# Patient Record
Sex: Male | Born: 1970 | Race: Black or African American | Hispanic: No | Marital: Married
Health system: Southern US, Community
[De-identification: ages and names within clinical notes are randomized; demographics above are authoritative.]

## PROBLEM LIST (undated history)

## (undated) DIAGNOSIS — I10 Essential (primary) hypertension: Secondary | ICD-10-CM

---

## 2006-04-16 ENCOUNTER — Emergency Department (HOSPITAL_COMMUNITY): Admission: EM | Admit: 2006-04-16 | Discharge: 2006-04-16 | Payer: Self-pay | Admitting: Emergency Medicine

## 2008-05-24 ENCOUNTER — Ambulatory Visit (HOSPITAL_COMMUNITY): Admission: RE | Admit: 2008-05-24 | Discharge: 2008-05-25 | Payer: Self-pay | Admitting: Otolaryngology

## 2010-06-25 LAB — CBC
Hemoglobin: 15.4 g/dL (ref 13.0–17.0)
MCV: 88.6 fL (ref 78.0–100.0)
RBC: 5.07 MIL/uL (ref 4.22–5.81)
WBC: 7.9 10*3/uL (ref 4.0–10.5)

## 2010-06-25 LAB — BASIC METABOLIC PANEL
BUN: 11 mg/dL (ref 6–23)
Calcium: 9.5 mg/dL (ref 8.4–10.5)
Potassium: 4.3 mEq/L (ref 3.5–5.1)

## 2010-07-28 NOTE — Op Note (Signed)
Douglas Dodson, Douglas Dodson                 ACCOUNT NO.:  [id number]   MEDICAL RECORD NO.:  [id number]          PATIENT TYPE:  OIB   LOCATION:  5126                         FACILITY:  [facility]   PHYSICIAN:  Newman Pies, MD            DATE OF BIRTH:  06/15/1970   DATE OF PROCEDURE:  05/24/2008  DATE OF DISCHARGE:                               OPERATIVE REPORT   SURGEON:  Newman Pies, MD   PREOPERATIVE DIAGNOSES:  1. Nasal septal deviation.  2. Bilateral inferior turbinate hypertrophy.   POSTOPERATIVE DIAGNOSES:  1. Nasal septal deviation.  2. Bilateral inferior turbinate hypertrophy.   PROCEDURE PERFORMED:  1. Septoplasty.  2. Bilateral partial inferior turbinate resection.   ANESTHESIA:  General endotracheal tube anesthesia.   COMPLICATIONS:  None.   ESTIMATED BLOOD LOSS:  Less than 20 mL.   INDICATIONS FOR PROCEDURE:  The patient is a 40 year old male with a  history of nasal septal deviation, bilateral inferior turbinate  hypertrophy, and chronic nasal obstruction.  He was previously treated  with steroid nasal spray, decongestant and antihistamine, and nasal  saline irrigation.  However, he continues to be symptomatic.  Based on  the above findings, the decision was made for the patient to undergo  septoplasty and bilateral partial inferior turbinate resection.  The  risks, benefits, alternatives, and details of the procedure were  discussed with the patient.  Questions were invited and answered.  Informed consent was obtained.   DESCRIPTION:  The patient was taken to the operating room and placed  supine on the operating table.  General endotracheal tube anesthesia was  administered by the anesthesiologist.  Preop IV antibiotics was given.  The patient was positioned and prepped and draped in the standard  fashion for nasal surgery.  Pledgets soaked with Afrin was placed in  both nasal cavities.  The pledgets were subsequently removed.  A 1%  lidocaine with 1:100,000 epinephrine was  injected onto the nasal septum  and the lateral nasal wall bilaterally.  A standard hemitransfixion  incision was made via the left nostril.  The submucosal flap was  elevated in a standard fashion on the left side.  Cartilaginous incision  was made approximately 1 cm superior to the caudal margin of the nasal  septum.  The mucosal flap was elevated on the contralateral side.  The  deviated portion of the cartilaginous and bony nasal septum were  removed.  The mucosa was then reapproximated with 4-0 plain gut sutures.  The hemitransfixion incision was closed with interrupted 4-0 chromic  sutures.   Attention was then focused on the inferior turbinates.  Both inferior  turbinates with significantly hypertrophy.  The inferior one-half of  each turbinate was clamped with a straight Kelly clamp.  They were  resected with the crosscutting scissors.  The same procedure was  repeated on the contralateral side.  A Doyle splint was subsequently  applied.  The patient tolerated the procedure well.  The patient was  awakened from anesthesia without difficulty.  He was extubated and  transferred to the recovery room in  good condition.   OPERATIVE FINDINGS:  1. Nasal septal deviation.  2. Bilateral inferior turbinate hypertrophy.   SPECIMENS REMOVED:  None.   FOLLOWUP CARE:  The patient will be observed overnight secondary to his  obstructive sleep apnea.  He will most likely be discharged home on  postop day #1.  He will be placed on Keflex 500 mg p.o. q.i.d. for 4  days, and Vicodin 1-2 tablet p.o. q.4-6 h. p.r.n. pain.  The patient  will follow up in my office in approximately 1 week.      Newman Pies, MD  Electronically Signed     ST/MEDQ  D:  05/24/2008  T:  05/24/2008  Job:  [id number]

## 2011-11-01 ENCOUNTER — Encounter: Payer: Self-pay | Admitting: Family Medicine

## 2012-03-12 ENCOUNTER — Encounter (HOSPITAL_COMMUNITY): Payer: Self-pay | Admitting: *Deleted

## 2012-03-12 ENCOUNTER — Emergency Department (INDEPENDENT_AMBULATORY_CARE_PROVIDER_SITE_OTHER)
Admission: EM | Admit: 2012-03-12 | Discharge: 2012-03-12 | Disposition: A | Discharge status: Home / Self Care | Payer: 59 | Source: Home / Self Care

## 2012-03-12 DIAGNOSIS — J329 Chronic sinusitis, unspecified: Secondary | ICD-10-CM

## 2012-03-12 LAB — POCT RAPID STREP A: Streptococcus, Group A Screen (Direct): NEGATIVE

## 2012-03-12 MED ORDER — AMOXICILLIN 500 MG PO CAPS: 500.0000 mg | ORAL_CAPSULE | Freq: Three times a day (TID) | ORAL | Status: DC

## 2012-03-12 MED ORDER — HYDROCODONE-HOMATROPINE 5-1.5 MG/5ML PO SYRP: 5.0000 mL | ORAL_SOLUTION | Freq: Four times a day (QID) | ORAL | Status: DC | PRN

## 2012-03-12 NOTE — ED Provider Notes (Signed)
Medical screening examination/treatment/procedure(s) were performed by non-physician practitioner and as supervising physician I was immediately available for consultation/collaboration.  Leslee Home, M.D.   Reuben Likes, MD 03/12/12 [phone]

## 2012-03-12 NOTE — ED Notes (Signed)
Patient complains of sore throat that started Friday; lost voice on Saturday; cough, chest and head congestion, sinus pain and pressure in ears. Denies fever/chills, nausea, vomiting, diarrhea.

## 2012-03-12 NOTE — ED Provider Notes (Signed)
History     CSN: [id number]  Arrival date & time 03/12/12  1526   None     Chief Complaint  Patient presents with  . URI    (Consider location/radiation/quality/duration/timing/severity/associated sxs/prior treatment) Patient is a 41 y.o. male presenting with URI. The history is provided by the patient. No language interpreter was used.  URI The primary symptoms include fever, headaches, sore throat and cough. The current episode started 3 to 5 days ago. This is a new problem. The problem has been gradually worsening.  Symptoms associated with the illness include chills, facial pain, sinus pressure, congestion and rhinorrhea. Risk factors: none.    History reviewed. No pertinent past medical history.  History reviewed. No pertinent past surgical history.  No family history on file.  History  Substance Use Topics  . Smoking status: Not on file  . Smokeless tobacco: Not on file  . Alcohol Use: Not on file      Review of Systems  Constitutional: Positive for fever and chills.  HENT: Positive for congestion, sore throat, rhinorrhea and sinus pressure.   Respiratory: Positive for cough.   Neurological: Positive for headaches.  All other systems reviewed and are negative.    Allergies  Review of patient's allergies indicates no known allergies.  Home Medications  No current outpatient prescriptions on file.  BP 126/78  Pulse 83  Temp 99.6 F (37.6 C) (Oral)  Resp 20  SpO2 98%  Physical Exam  Nursing note and vitals reviewed. Constitutional: He appears well-developed.  HENT:  Head: Normocephalic and atraumatic.  Right Ear: External ear normal.  Left Ear: External ear normal.  Nose: Nose normal.  Mouth/Throat: Oropharynx is clear and moist.       Tender bilat maxillary sinuses  Eyes: Conjunctivae normal and EOM are normal. Pupils are equal, round, and reactive to light.  Neck: Normal range of motion. Neck supple.  Cardiovascular: Normal rate and normal  heart sounds.   Pulmonary/Chest: Effort normal and breath sounds normal.  Musculoskeletal: Normal range of motion.  Neurological: He is alert.  Skin: Skin is warm.    ED Course  Procedures (including critical care time)   Labs Reviewed  POCT RAPID STREP A ([facility] CARE ONLY)   No results found.   No diagnosis found.    MDM  Amoxicillian,   Hycodan cough syrup        Lonia Skinner [place], [place] 03/12/12 1723

## 2013-10-03 ENCOUNTER — Emergency Department (HOSPITAL_COMMUNITY)
Admission: EM | Admit: 2013-10-03 | Discharge: 2013-10-03 | Disposition: A | Discharge status: Home / Self Care | Payer: 59 | Attending: Emergency Medicine | Admitting: Emergency Medicine

## 2013-10-03 ENCOUNTER — Encounter (HOSPITAL_COMMUNITY): Payer: Self-pay | Admitting: Emergency Medicine

## 2013-10-03 ENCOUNTER — Emergency Department (HOSPITAL_COMMUNITY): Payer: 59

## 2013-10-03 DIAGNOSIS — M542 Cervicalgia: Secondary | ICD-10-CM | POA: Insufficient documentation

## 2013-10-03 DIAGNOSIS — Z79899 Other long term (current) drug therapy: Secondary | ICD-10-CM | POA: Insufficient documentation

## 2013-10-03 DIAGNOSIS — I10 Essential (primary) hypertension: Secondary | ICD-10-CM | POA: Insufficient documentation

## 2013-10-03 DIAGNOSIS — Z791 Long term (current) use of non-steroidal anti-inflammatories (NSAID): Secondary | ICD-10-CM | POA: Insufficient documentation

## 2013-10-03 DIAGNOSIS — M62838 Other muscle spasm: Secondary | ICD-10-CM | POA: Insufficient documentation

## 2013-10-03 HISTORY — DX: Essential (primary) hypertension: I10

## 2013-10-03 MED ORDER — CYCLOBENZAPRINE HCL 10 MG PO TABS: 10.0000 mg | ORAL_TABLET | Freq: Two times a day (BID) | ORAL | Status: AC | PRN

## 2013-10-03 MED ORDER — MELOXICAM 7.5 MG PO TABS: 7.5000 mg | ORAL_TABLET | Freq: Every day | ORAL | Status: AC

## 2013-10-03 MED ORDER — IBUPROFEN 400 MG PO TABS
400.0000 mg | ORAL_TABLET | Freq: Once | ORAL | Status: AC
  Administered 2013-10-03: 400 mg via ORAL
  Filled 2013-10-03: qty 1

## 2013-10-03 MED ORDER — KETOROLAC TROMETHAMINE 30 MG/ML IJ SOLN
30.0000 mg | Freq: Once | INTRAMUSCULAR | Status: AC
  Administered 2013-10-03: 30 mg via INTRAMUSCULAR

## 2013-10-03 MED ORDER — KETOROLAC TROMETHAMINE 30 MG/ML IJ SOLN
30.0000 mg | Freq: Once | INTRAMUSCULAR | Status: DC
  Filled 2013-10-03: qty 1

## 2013-10-03 NOTE — ED Notes (Signed)
Patient currently waiting med time before safely discharging.

## 2013-10-03 NOTE — ED Notes (Signed)
Pt in stating he was at work and was lifting something heavy and felt a pop in the top of his back and neck, since that time has developed progressive soreness and tightness to the area, no history of same, pt ambulatory to room without distress.

## 2013-10-03 NOTE — Discharge Instructions (Signed)

## 2013-10-03 NOTE — ED Provider Notes (Signed)
Medical screening examination/treatment/procedure(s) were performed by non-physician practitioner and as supervising physician I was immediately available for consultation/collaboration.   EKG Interpretation None       Doug SouSam Modelle Vollmer, MD 10/03/13 [phone]

## 2013-10-03 NOTE — ED Provider Notes (Signed)
CSN: [id number]     Arrival date & time 10/03/13  1847 History   This chart was scribed for Terri Piedraourtney Forcucci, PA-C working with Doug SouSam Jacubowitz, MD by Evon Slackerrance Branch, ED Scribe. This patient was seen in room TR09C/TR09C and the patient's care was started at 9:09 PM.     Chief Complaint  Patient presents with  . Neck Pain   Patient is a 43 y.o. male presenting with neck pain. The history is provided by the patient. No language interpreter was used.  Neck Pain Associated symptoms: no fever and no numbness    HPI Comments: Douglas Dodson is a 43 y.o. male who presents to the Emergency Department complaining of neck pain  He states that he was doing heavy lifting at work when he first noticed the pain. He states he has associated pain in his shoulders. He states he hasn't taken an medication prior to arrival. He states that turning his head makes his pain worse. He states that his pain is 8/10. He denies numbness, bladder/bowel incontinence, fever, chills, nausea, vomiting or SOB.  Np previous history of neck pain.  Past Medical History  Diagnosis Date  . Hypertension    History reviewed. No pertinent past surgical history. History reviewed. No pertinent family history. History  Substance Use Topics  . Smoking status: Not on file  . Smokeless tobacco: Not on file  . Alcohol Use: Not on file    Review of Systems  Constitutional: Negative for fever and chills.  Respiratory: Negative for shortness of breath.   Gastrointestinal: Negative for nausea and vomiting.  Genitourinary: Negative.   Musculoskeletal: Positive for arthralgias and neck pain.  Neurological: Negative for numbness.   Allergies  Review of patient's allergies indicates no known allergies.  Home Medications   Prior to Admission medications   Medication Sig Start Date End Date Taking? Authorizing Provider  Aspirin-Salicylamide-Caffeine (BC HEADACHE POWDER PO) Take 1 each by mouth as needed (pain).    Yes Historical  Provider, MD  olmesartan (BENICAR) 20 MG tablet Take 20 mg by mouth daily.   Yes Historical Provider, MD  cyclobenzaprine (FLEXERIL) 10 MG tablet Take 1 tablet (10 mg total) by mouth 2 (two) times daily as needed for muscle spasms. 10/03/13   Eri Platten A Forcucci, PA-C  meloxicam (MOBIC) 7.5 MG tablet Take 1 tablet (7.5 mg total) by mouth daily. 10/03/13   Lindie Roberson A Forcucci, PA-C   Triage vitals: BP 164/57  Pulse 77  Temp(Src) 98 F (36.7 C) (Oral)  Resp 20  Ht 6' (1.829 m)  Wt 228 lb (103.42 kg)  BMI 30.92 kg/m2  SpO2 100%  Physical Exam  Nursing note and vitals reviewed. Constitutional: He is oriented to person, place, and time. He appears well-developed and well-nourished. No distress.  HENT:  Head: Normocephalic and atraumatic.  Eyes: Conjunctivae and EOM are normal.  Neck: Neck supple. No tracheal deviation present.  Cardiovascular: Normal rate, regular rhythm and normal heart sounds.  Exam reveals no gallop.   No murmur heard. Pulmonary/Chest: Effort normal and breath sounds normal. No respiratory distress. He has no wheezes. He has no rales.  Musculoskeletal: Normal range of motion.  Patient rises slowly from sitting to standing.  They walk without an antalgic gait.  There is no evidence of erythema, ecchymosis, or gross deformity.  There is tenderness to palpation over the trapezius and the cervical paraspinal muscles.  Active ROM is full but painful.  Sensation to light touch is intact over all extremities.  Strength  is symmetric and equal in all extremities.  DTRs are equal and symmetric in all extremities.       Neurological: He is alert and oriented to person, place, and time. He has normal strength and normal reflexes. No cranial nerve deficit or sensory deficit. Coordination normal.  Skin: Skin is warm and dry.  Psychiatric: He has a normal mood and affect. His behavior is normal. Judgment and thought content normal.    ED Course  Procedures (including critical care  time) DIAGNOSTIC STUDIES: Oxygen Saturation is 100% on RA, normal by my interpretation.    COORDINATION OF CARE:    Labs Review Labs Reviewed - No data to display  Imaging Review Dg Cervical Spine Complete  10/03/2013   CLINICAL DATA:  Neck pain.  Injury.  EXAM: CERVICAL SPINE  4+ VIEWS  COMPARISON:  None.  FINDINGS: Straightening of the normal cervical lordosis is identified. Vertebral body height and alignment are maintained. Intervertebral disc space height is normal. Neural foramina appear widely patent. Prevertebral soft tissues appear normal. Lung apices are clear.  IMPRESSION: Negative exam.   Electronically Signed   By: Drusilla Kanner M.D.   On: 10/03/2013 20:06     EKG Interpretation None      MDM   Final diagnoses:  Neck muscle spasm    Patient is a 43 y.o. Male who presents to the ED with neck pain.  There is no concern for cauda equina at this time.  There are no focal neuro deficits on exam.  Patient will be treated here with toradol.  Will discharge the patient home with mobic 7.5 mg and flexeril.  Patient was given return precautions of cauda equina.  He states understanding and agreement to the above plan.   I personally performed the services described in this documentation, which was scribed in my presence. The recorded information has been reviewed and is accurate.      Eben Burow, PA-C 10/03/13 2140

## 2013-10-03 NOTE — ED Notes (Signed)
Forcucci, PA-C at bedside.   

## 2015-12-18 IMAGING — CR DG CERVICAL SPINE COMPLETE 4+V
5 series · 5 of 5 positions shown · non-contrast
Comparison: None.

CLINICAL DATA: Neck pain.  Injury.

EXAM:
CERVICAL SPINE  4+ VIEWS

[w c-spine lat]
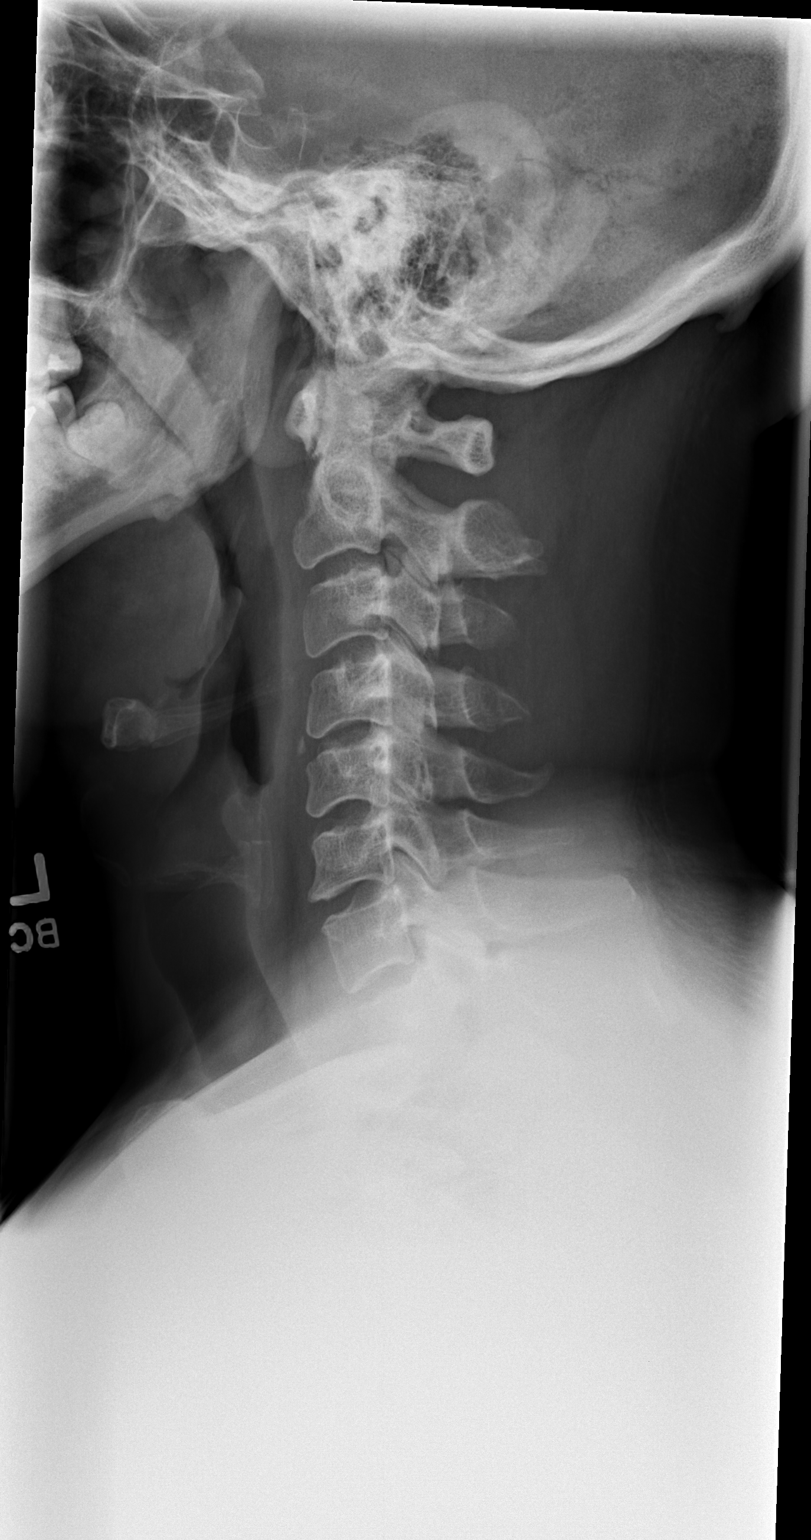

[w c-spine oblique (1 of 2)]
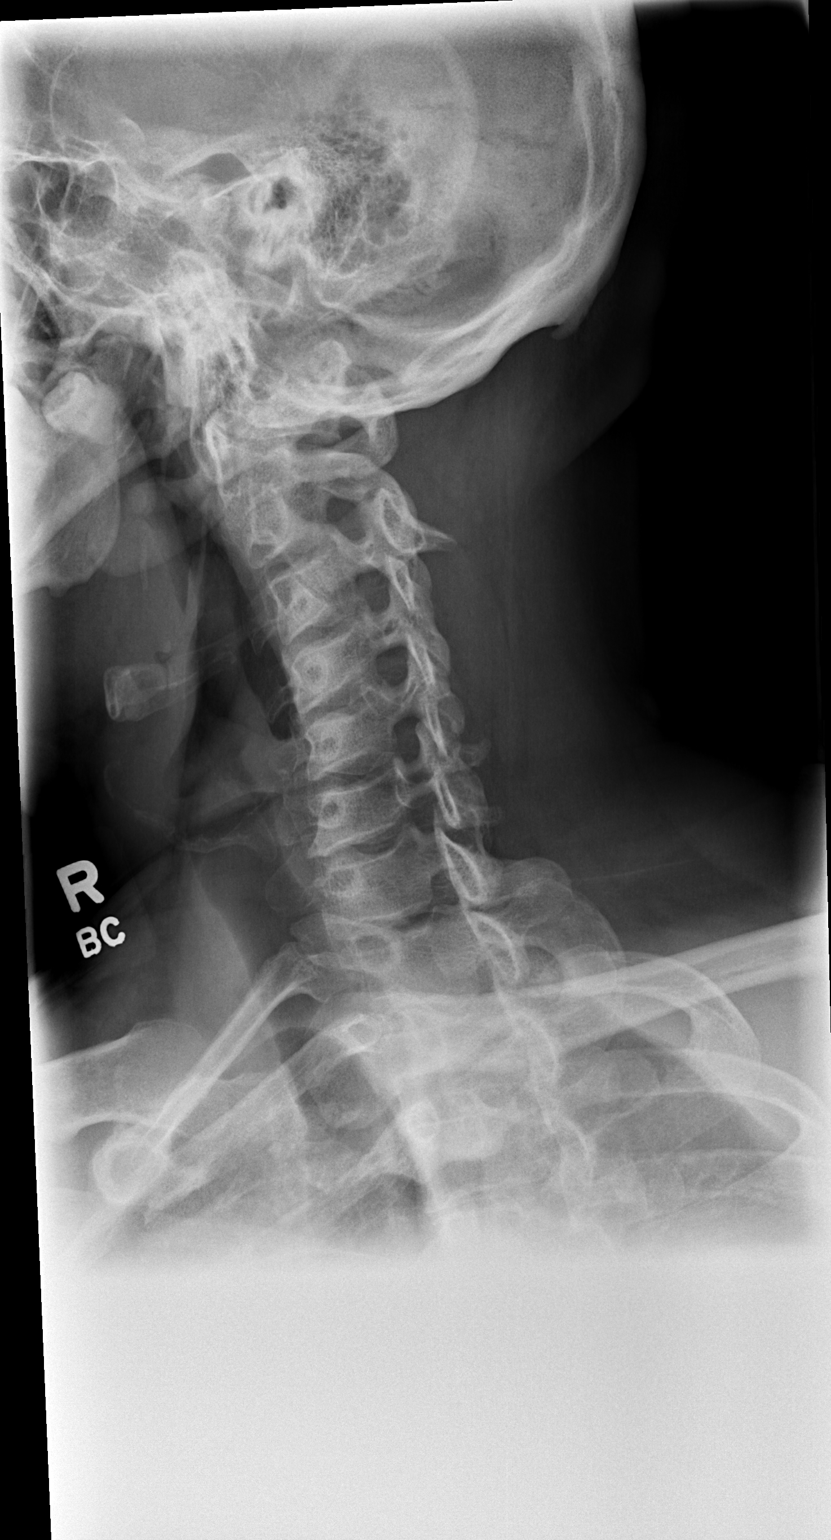

[w c-spine oblique (2 of 2)]
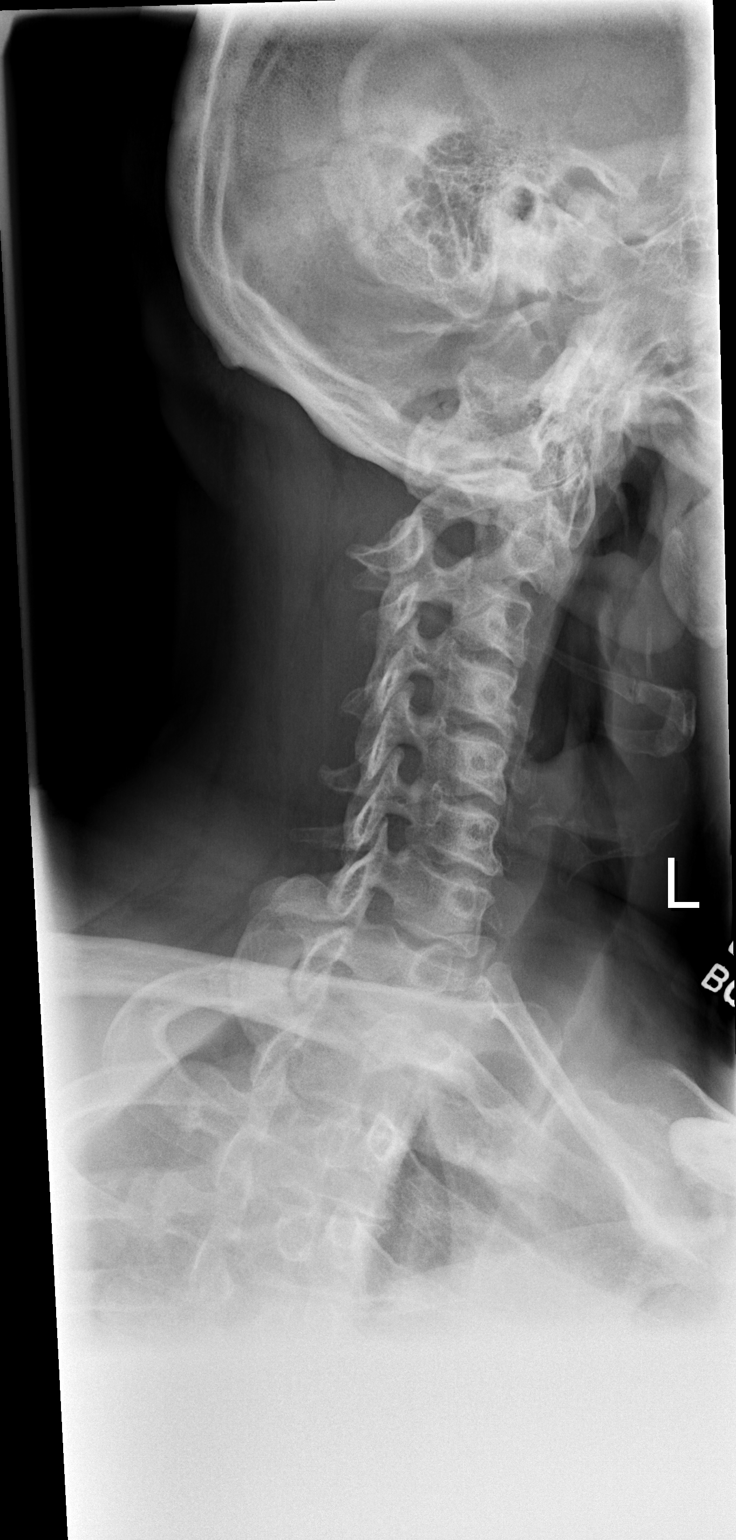

[w c-spine a.p.]
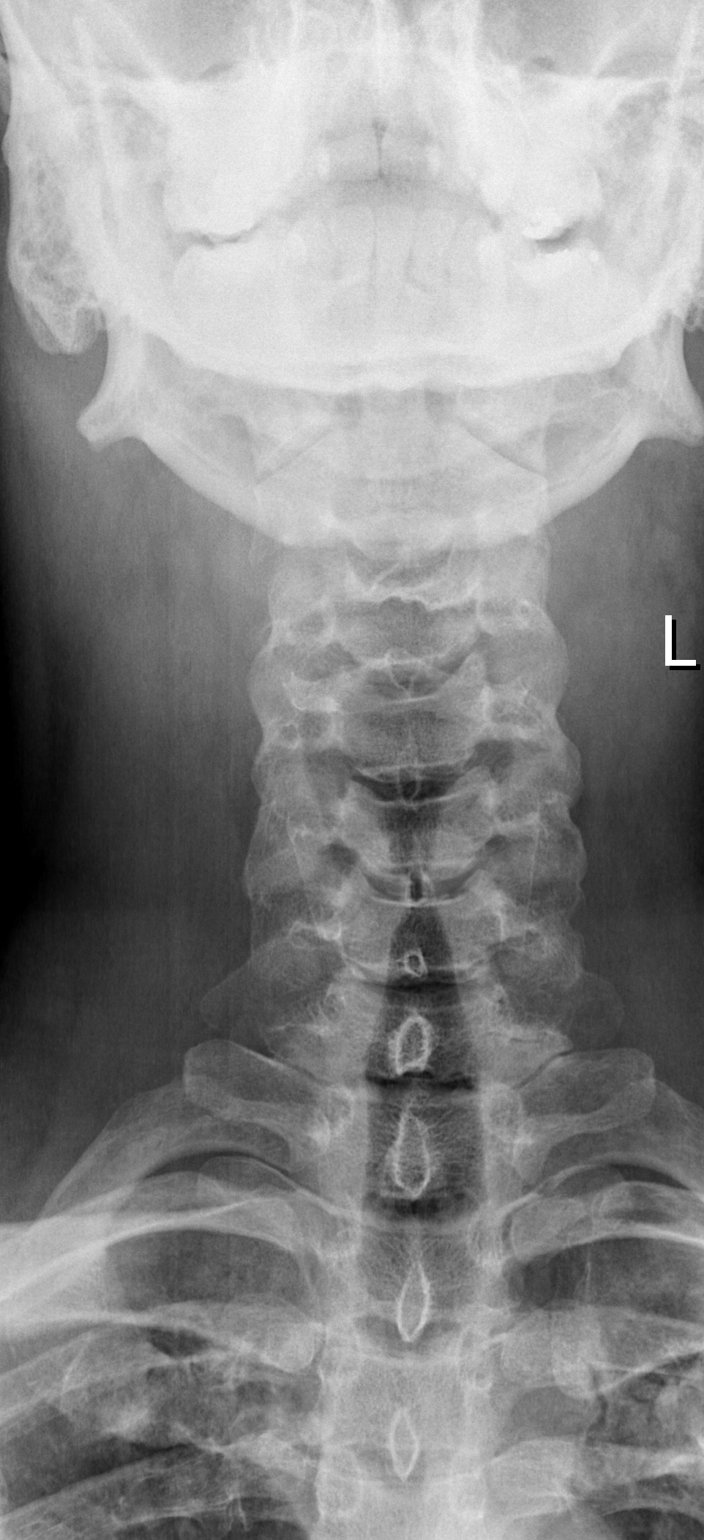

[w c-spine odontoid]
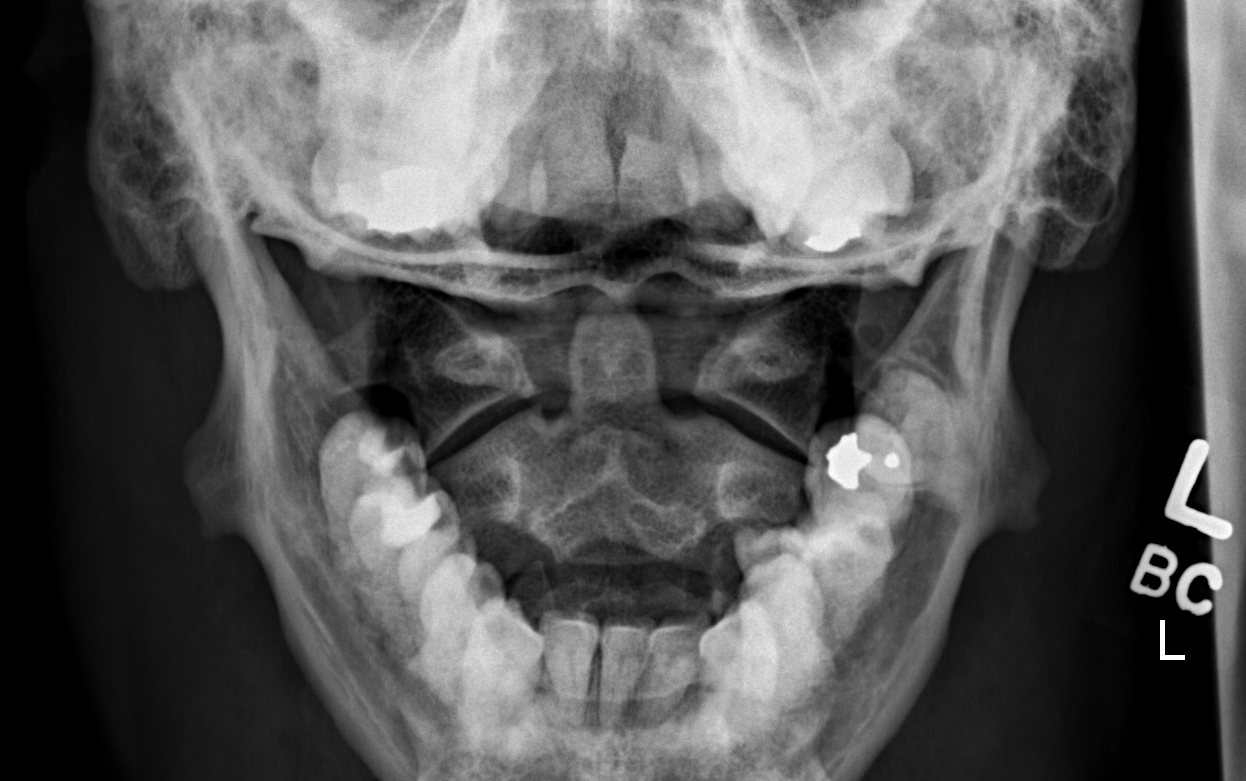

[5 of 5 positions shown; findings below may reference images not displayed]

FINDINGS: Straightening of the normal cervical lordosis is identified.
Vertebral body height and alignment are maintained. Intervertebral
disc space height is normal. Neural foramina appear widely patent.
Prevertebral soft tissues appear normal. Lung apices are clear.
IMPRESSION: Negative exam.
